# Patient Record
Sex: Female | Born: 2005 | Race: White | Hispanic: No | Smoking: Never smoker
Health system: Southern US, Community
[De-identification: ages and names within clinical notes are randomized; demographics above are authoritative.]

---
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
## 2024-03-19 ENCOUNTER — Ambulatory Visit: Admitting: Family Medicine

## 2024-03-19 ENCOUNTER — Encounter: Payer: Self-pay | Admitting: Family Medicine

## 2024-03-19 ENCOUNTER — Telehealth: Payer: Self-pay | Admitting: Family Medicine

## 2024-03-19 VITALS — BP 98/68 | HR 105 | Temp 98.1°F | Ht 63.0 in | Wt 139.0 lb

## 2024-03-19 DIAGNOSIS — M545 Low back pain, unspecified: Secondary | ICD-10-CM | POA: Insufficient documentation

## 2024-03-19 DIAGNOSIS — G4700 Insomnia, unspecified: Secondary | ICD-10-CM | POA: Insufficient documentation

## 2024-03-19 DIAGNOSIS — B001 Herpesviral vesicular dermatitis: Secondary | ICD-10-CM | POA: Insufficient documentation

## 2024-03-19 DIAGNOSIS — M25572 Pain in left ankle and joints of left foot: Secondary | ICD-10-CM | POA: Diagnosis not present

## 2024-03-19 DIAGNOSIS — R0789 Other chest pain: Secondary | ICD-10-CM

## 2024-03-19 LAB — URINALYSIS, ROUTINE W REFLEX MICROSCOPIC
Hgb urine dipstick: NEGATIVE
Ketones, ur: NEGATIVE
Leukocytes,Ua: NEGATIVE
Nitrite: NEGATIVE
Specific Gravity, Urine: >=1.03 — AB (ref 1.000–1.030)
Urine Glucose: NEGATIVE
Urobilinogen, UA: 0.2 (ref 0.0–1.0)
pH: 5.5 (ref 5.0–8.0)

## 2024-03-19 MED ORDER — MELOXICAM 15 MG PO TABS: 15.0000 mg | ORAL_TABLET | Freq: Every day | ORAL | 0 refills | Status: AC

## 2024-03-19 MED ORDER — DOCOSANOL 10 % EX CREA: TOPICAL_CREAM | CUTANEOUS | 0 refills | Status: AC

## 2024-03-19 MED ORDER — VALACYCLOVIR HCL 1 G PO TABS
2000.0000 mg | ORAL_TABLET | Freq: Two times a day (BID) | ORAL | 0 refills | Status: AC | PRN
Start: 2024-03-19 — End: ?

## 2024-03-19 NOTE — Progress Notes (Signed)
 Makiah Foye is a 18 y.o. female who presents today for an office visit.  Assessment/Plan:  New/Acute Problems: Ankle Pain No red flags.  Consistent with mild sprain.  Discussed conservative measures including rest, ice, compression, and elevation.  We also starting meloxicam 15 mg daily.  She will let us  know if not improving.  Left Chest Wall Pain Overall reassuring exam.  Low suspicion for rib fracture.  We did discuss imaging however this would not significantly change management at this point.  She is agreeable to defer imaging for now.  We are starting meloxicam as above which should help.  She will let us  know if not improving in the next few weeks  Chronic Problems Addressed Today: Low back pain This has been an intermittent issue for several years which she has associated with kidney issues.  Symptoms have worsened recently potentially due to recent fall and trauma.  We are starting meloxicam as above.  We are checking UA and urine culture today.  We did discuss imaging and referrals today however she would like to hold off on this for now.  She will let us  know if symptoms do not improve and would consider referral to orthopedics or sports medicine at that time.  Insomnia This has been a chronic issue for many years.  She has been on several medications in the past but did not find them particularly effective.  She is sometimes waking up at night with heavy breathing.  We did discuss referral for sleep study to rule out OSA or other potential causes however she would like to hold off on this for now.  We also did discuss trial of medication to help treatment for insomnia however she would like to hold on this for now as well.  She will let us  know if she changes her mind about referrals.  Cold sore Patient with cold sore on the left lower lip today.  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  We discussed treatment options for this.  Will send prescription in  for Valtrex to 2000 mg twice daily as needed for 1 day.  Also send prescription in for Abreva as she has done well with this previously.     Subjective:  HPI:  See assessment / plan for status of chronic conditions.  Patient here with a few issues that she like to discuss today.  She was at a trampoline park for a birthday party 3 days ago and while there she landed on her left ankle and had immediate pain to the area.  She did have some swelling but this has improved.  Still having some pain with certain motions but overall symptoms do seem to be improving.  She is able to walk with minimal pain  The next day she tripped at home and fell onto a Tree onto her left side and head.  She has had significant pain to her left wrist while but no bruising.  Pain is worse with certain motions.  She has also had ongoing back pain which has been an ongoing issue for many years but seems to have worsened recently.  She has tried many medications and is in the past including Advil  and Tylenol  without much improvement.  She would like to have kidneys checked today she is worried about recurrent kidney stones.  No fevers or chills.  She has also had ongoing issues with chronic insomnia.  This has been going on for years  as well.  She has been on several medications in the past but did not find them particularly effective.  She has also tried over-the-counter melatonin without much improvement.  She also has current fever blister on left lower lip.  Started a few days ago.  She has had this for many years intermittently and typically uses a cream which does help.        Objective:  Physical Exam: BP 98/68   Pulse (!) 105   Temp 98.1 F (36.7 C) (Temporal)   Ht 5' 3 (1.6 m)   Wt 139 lb (63 kg)   SpO2 99%   BMI 24.62 kg/m   Gen: No acute distress, resting comfortably HEENT: Healing cold sore on left lower lip CV: Regular rate and rhythm with no murmurs appreciated Pulm: Normal work of breathing,  clear to auscultation bilaterally with no crackles, wheezes, or rhonchi MUSCULOSKELETAL - Left Foot: No deformities.  Tender to palpation along anterior lateral malleolus.  Neurovascular intact distally.  Nontender to palpation along midfoot and posterior malleoli - ribs: No deformities.  No ecchymosis.  Tender to palpation along left lower costal edge - Back: No deformities.  Tenderness palpation along lower lumbar paraspinal muscle groups near SI joint.  Neurovascular intact distally Neuro: Grossly normal, moves all extremities Psych: Normal affect and thought content      Jasir Rother M. Kennyth, MD 03/19/2024 10:38 AM

## 2024-03-19 NOTE — Telephone Encounter (Signed)
 Called pt & stated no SOB, no problems urinating, and no other symptoms. Did not go to ED

## 2024-03-19 NOTE — Assessment & Plan Note (Signed)
 Patient with cold sore on the left lower lip today.  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  We discussed treatment options for this.  Will send prescription in for Valtrex to 2000 mg twice daily as needed for 1 day.  Also send prescription in for Abreva as she has done well with this previously.

## 2024-03-19 NOTE — Patient Instructions (Signed)
 It was very nice to see you today!  VISIT SUMMARY: During your visit, we addressed multiple injuries, back pain, and insomnia. You sustained an ankle sprain, a rib injury, and are experiencing chronic back pain and insomnia. We also discussed your recurrent fever blisters.  YOUR PLAN: RIGHT ANKLE SPRAIN: You injured your ankle at a trampoline park, and although the swelling has decreased, you still have sharp pain. -Apply ice and keep your ankle elevated. -Take meloxicam as prescribed to help with the pain.  RIB INJURY, POSSIBLE FRACTURE OR CONTUSION: You have soreness and difficulty breathing after falling into a cat tree. There is no bruising, but it could be a fracture or contusion. -Monitor your symptoms and use supportive care.  CHRONIC BACK PAIN You have severe back pain that might be related to a kidney condition. Over-the-counter pain relievers have not helped. -We will test your urine to check your kidney function. -Take meloxicam as prescribed to help with the pain.  INSOMNIA: You have chronic insomnia and have not found relief with previous treatments. You do not have symptoms of sleep apnea. -Consider a sleep study to better understand your sleep issues. -Follow up with us  if you are interested in pursuing a sleep study.  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 -Take Valtrex as prescribed. -Apply Abreva cream to the affected area.  Return if symptoms worsen or fail to improve.   Take care, Dr Kennyth  PLEASE NOTE:  If you had any lab tests, please let us  know if you have not heard back within a few days. You may see your results on mychart before we have a chance to review them but we will give you a call once they are reviewed by us .   If we ordered any referrals today, please let us  know if you have not heard from their office within the next week.   If you had any urgent prescriptions sent in  today, please check with the pharmacy within an hour of our visit to make sure the prescription was transmitted appropriately.   Please try these tips to maintain a healthy lifestyle:  Eat at least 3 REAL meals and 1-2 snacks per day.  Aim for no more than 5 hours between eating.  If you eat breakfast, please do so within one hour of getting up.   Each meal should contain half fruits/vegetables, one quarter protein, and one quarter carbs (no bigger than a computer mouse)  Cut down on sweet beverages. This includes juice, soda, and sweet tea.   Drink at least 1 glass of water with each meal and aim for at least 8 glasses per day  Exercise at least 150 minutes every week.

## 2024-03-19 NOTE — Assessment & Plan Note (Signed)
 This has been a chronic issue for many years.  She has been on several medications in the past but did not find them particularly effective.  She is sometimes waking up at night with heavy breathing.  We did discuss referral for sleep study to rule out OSA or other potential causes however she would like to hold off on this for now.  We also did discuss trial of medication to help treatment for insomnia however she would like to hold on this for now as well.  She will let us  know if she changes her mind about referrals.

## 2024-03-19 NOTE — Assessment & Plan Note (Signed)
 This has been an intermittent issue for several years which she has associated with kidney issues.  Symptoms have worsened recently potentially due to recent fall and trauma.  We are starting meloxicam as above.  We are checking UA and urine culture today.  We did discuss imaging and referrals today however she would like to hold off on this for now.  She will let us  know if symptoms do not improve and would consider referral to orthopedics or sports medicine at that time.

## 2024-03-20 ENCOUNTER — Ambulatory Visit: Payer: Self-pay

## 2024-03-20 NOTE — Telephone Encounter (Signed)
 FYI Only or Action Required?: FYI only for provider: appointment scheduled on 03/21/24.  Patient was last seen in primary care on 03/19/2024 by Kennyth Worth HERO, MD.  Called Nurse Triage reporting Fall.  Symptoms began yesterday.  Interventions attempted: OTC medications: Tylenol .  Symptoms are: gradually worsening.  Triage Disposition: See Physician Within 24 Hours  Patient/caregiver understands and will follow disposition?: Yes  Copied from CRM #[id number]. Topic: Clinical - Red Word Triage >> Mar 20, 2024  1:52 PM Asberry HERO wrote: Red Word that prompted transfer to Nurse Triage: patient fell down stairs, re injured ribs, having a lot of pain, shooting pain from ankle to knee, ripped toenail. Additional Information  Commented on: All Negative - See Physician Within 24 Hours    Pt with recent fall around 11/3 after falling on wooden cat tree and hitting left ribcage. Saw PCP on 11/7, low suspicion for fx at that time. Pt fell again last night with increased pain in left ribcage from moderate to severe. Denies SOB or coughing up blood. Pt noted to sound non-distressed and speaking in full clear sentences over the phone. No cuts or lacerations. Advised to f/u with provider in 24 hrs.  Answer Assessment - Initial Assessment Questions Pt with recent fall around 11/3 after falling on wooden cat tree and hitting left ribcage. Saw PCP on 11/7, low suspicion for rib fx at that time. Pt fell again last night after her socks got caught on her carpeted stairs with increased pain in left ribcage from moderate to severe. Denies SOB or coughing up blood. Pt noted to sound non-distressed and speaking in full clear sentences over the phone. No cuts or lacerations. Also hit head, no neuro changes, not taking a blood thinner, just mild swelling on temple area. Mild headache. Denies dizziness or domestic abuse, reports she is a generally clumsy person and falls frequently. Scheduled OV with PCP tomorrow, advised UC  or ED for worsening symptoms.  1. MECHANISM: How did the injury happen? For falls, ask: What height did you fall from? and What surface did you fall against?      Hit head several times. Once on car, once on tv, once on cat tree, once while walking down some stairs. Most recently fell down stairs last night. Reports mild headache at this time.  2. ONSET: When did the injury happen? (e.g., minutes, hours ago)      Last night  3. NEUROLOGIC SYMPTOMS: Was there any loss of consciousness? Are there any other neurological symptoms?      Minor headache currently  4. MENTAL STATUS: Does the person know who they are, who you are, and where they are?      AOO  5. LOCATION: What part of the head was hit?      Back and top of head  6. SCALP APPEARANCE: What does the scalp look like? Is it bleeding now? If Yes, ask: Is it difficult to stop?      Swollen on the front of head.   7. SIZE: For cuts, bruises, or swelling, ask: How large is it? (e.g., inches or centimeters)      Some swelling on front of head. No lacerations or bruising.  8. PAIN: Is there any pain? If Yes, ask: How bad is it? (Scale 0-10; or none, mild, moderate, severe)     Mild headache pain  9. TETANUS: For any breaks in the skin, ask: When was your last tetanus booster?     No cuts.  10. BLOOD THINNERS: Do you take any blood thinners? (e.g., aspirin, clopidogrel / Plavix, coumadin, heparin). Notes: Other strong blood thinners include: Arixtra (fondaparinux), Eliquis (apixaban), Pradaxa (dabigatran), and Xarelto (rivaroxaban).       Denies  11. OTHER SYMPTOMS: Do you have any other symptoms? (e.g., neck pain, vomiting)       Left ribcage injury/pain a few days prior to PCP appt yesterday, low suspicion for fracture per PCP. Hit left ribcage again after falling down stairs, now with 8/10 pain, 10/10 when breathing in. Denies SOB currently or coughing up blood. Also injured left ankle and left big toe  with some swelling.  12. PREGNANCY: Is there any chance you are pregnant? When was your last menstrual period?       Denies. Has IUD.  Answer Assessment - Initial Assessment Questions 1. MECHANISM: How did the fall happen?     Socks got caught on her carpeted stairs and pt fell down stairs  2. DOMESTIC VIOLENCE AND ELDER ABUSE SCREENING: Did you fall because someone pushed you or tried to hurt you? If Yes, ask: Are you safe now?     Pt denies. Reports feeling safe at home  3. ONSET: When did the fall happen? (e.g., minutes, hours, or days ago)     Last night  4. LOCATION: What part of the body hit the ground? (e.g., back, buttocks, head, hips, knees, hands, head, stomach)     Head, left ribcage, left ankle, foot and toenail  5. INJURY: Did you hurt (injure) yourself when you fell? If Yes, ask: What did you injure? Tell me more about this? (e.g., body area; type of injury; pain severity)     Mild swelling on front temple with mild headache. Left ribcage 8/10 pain, 10/10 when taking a deep breathe. Denies SOB or coughing up blood. Some swelling of left ankle and left foot/toenail, able to hobble around per pt.   6. PAIN: Is there any pain? If Yes, ask: How bad is the pain? (e.g., Scale 0-10; or none, mild,      Mild headache pain. Left ribcage 8/10 pain, 10/10 when taking a deep breathe. Moderate left ankle pain  7. SIZE: For cuts, bruises, or swelling, ask: How large is it? (e.g., inches or centimeters)      Moderate swelling left foot and ankle and left ribcage. Mild swelling left temple.  9. OTHER SYMPTOMS: Do you have any other symptoms? (e.g., dizziness, fever, weakness; new-onset or worsening).      Denies above.  10. CAUSE: What do you think caused the fall (or falling)? (e.g., dizzy spell, tripped)       Pt reports she is a generally clumsy person with hx of frequent falls.  Protocols used: Head Injury-A-AH, Falls and Falling-A-AH

## 2024-03-21 ENCOUNTER — Ambulatory Visit (INDEPENDENT_AMBULATORY_CARE_PROVIDER_SITE_OTHER)

## 2024-03-21 ENCOUNTER — Encounter: Payer: Self-pay | Admitting: Family Medicine

## 2024-03-21 ENCOUNTER — Ambulatory Visit: Payer: Self-pay | Admitting: Family Medicine

## 2024-03-21 ENCOUNTER — Ambulatory Visit (INDEPENDENT_AMBULATORY_CARE_PROVIDER_SITE_OTHER): Admitting: Family Medicine

## 2024-03-21 VITALS — BP 120/80 | HR 103 | Temp 99.0°F | Ht 63.0 in | Wt 143.6 lb

## 2024-03-21 DIAGNOSIS — M79675 Pain in left toe(s): Secondary | ICD-10-CM

## 2024-03-21 DIAGNOSIS — R0789 Other chest pain: Secondary | ICD-10-CM | POA: Diagnosis not present

## 2024-03-21 MED ORDER — ACETAMINOPHEN-CODEINE 300-30 MG PO TABS: 1.0000 | ORAL_TABLET | ORAL | 0 refills | Status: AC | PRN

## 2024-03-21 NOTE — Progress Notes (Signed)
 Good news!  Her x-ray is normal.  No signs of fracture.  She does have mild scoliosis in her back which is probably causing some of her back pain.  Her rib pain should improve over the next several days.  She should let us  know if she has any worsening symptoms or if her pain fails to improve.

## 2024-03-21 NOTE — Telephone Encounter (Signed)
 She probably has a contusion or bone bruise. This does not show up on X-rays but can still be very painful because the ribs have a lot of nerve endings.  This should heal over the next several days.   The radiologist did not mention any change in the scoliosis compared to her previous x-ray however the x-ray was primarily focusing on the ribs and is not the best test to compare for scoliosis.  We can obtain more x-rays to focus more on the scoliosis part of her back if she wants to come back for that or we can refer her to see a back specialist.

## 2024-03-21 NOTE — Progress Notes (Signed)
 Natasha Page is a 18 y.o. female who presents today for an office visit.  Assessment/Plan:  Left Rib Pain Patient here with left rib pain.  I saw her a couple days ago for this after recent fall however unfortunately she fell 2 days ago at home landing on the stairs at home and pain has significantly worsened.  She has not had much improvement meloxicam.  Will check plain film today to rule out fracture though did discuss with patient that pain control is the main priority at this point.  She has had bad reaction with tramadol in the past though has tolerated codeine well.  Will send in Tylenol  3 every 4-6 hours as needed.  Is okay for her to continue meloxicam with this as well.  We discussed reasons to return to care.  Left great toe pain Patient with subungual hematoma.  We did discuss plain film today however she declined as she does not think that she broke the toe.  Also discussed trephination however she declined this as well.  We recommended ice and compression.  We discussed reasons to return to care    Subjective:  HPI:  See assessment / plan for status of chronic conditions.   Discussed the use of AI scribe software for clinical note transcription with the patient, who gave verbal consent to proceed.  History of Present Illness Natasha Page is a 18 year old female who presents with increased pain following a fall down the stairs.  She was seen here 2 days ago for back and rib pain after being injured at a trampoline park.  Unfortunately the night after our most recent visit she fell down a spiral staircase at home while texting on her phone, slipping on the carpeted stairs while wearing socks. She fell backward, initially landing on her back and then turning onto her side as she descended a few steps. She estimates falling from the third step and landing on four stairs.  She describes significant pain in her ribs, lower back, and between her shoulder blades, which  has worsened since the fall. The pain is constant and described as a pressure-like tenderness, particularly in the ribs. She rates her back pain as a 6 out of 10, noting it is worse than usual but overshadowed by the rib pain. She has a history of chronic back pain, which she has been experiencing for years. No bruising was reported on her ribs, but she describes tenderness when taking a deep breath.  Her left ankle and toe are also painful. The toenail was partially detached during the fall, resulting in a blackened appearance due to dried blood underneath. She experiences pulsating pain in the toe and is unable to put pressure on it. The toe bled on the night of the fall, and she has been managing it with bandages and ointment.  She mentions hitting the back of her head during the fall, specifically on the occipital bone, and has since experienced difficulty concentrating and completing simple tasks, raising concerns about a possible mild concussion.  She has been taking meloxicam since the day of the fall but feels it has not provided significant relief. She continues to use Tylenol  for pain management. She has a history of adverse reactions to tramadol and morphine, including hallucinations and burning sensations, respectively. She is concerned about her sensitive stomach, which has reacted to medications like ibuprofen  in the past, causing nausea.         Objective:  Physical Exam: BP 120/80   Pulse (!) 103   Temp 99 F (37.2 C)   Ht 5' 3 (1.6 m)   Wt 143 lb 9.6 oz (65.1 kg)   SpO2 97%   BMI 25.44 kg/m   Gen: No acute distress, resting comfortably CV: Regular rate and rhythm with no murmurs appreciated Pulm: Normal work of breathing, clear to auscultation bilaterally with no crackles, wheezes, or rhonchi MUSCULOSKELETAL: - Chest: No deformities.  Tender to palpation along left lower ribs.  - Left Foot: No deformities or open wounds. Subungual hematoma noted under great toenail.   Neurovascularly intact distally.  Good distal cap refill.  Very tender to palpation. Neuro: Grossly normal, moves all extremities Psych: Normal affect and thought content      Kimble Delaurentis M. Kennyth, MD 03/21/2024 10:42 AM

## 2024-03-21 NOTE — Patient Instructions (Signed)
 It was very nice to see you today!  VISIT SUMMARY: You visited us  today due to increased pain following a fall down the stairs. We discussed your rib, back, ankle, and toe pain, as well as a potential mild concussion.  YOUR PLAN: RIB PAIN AFTER FALL: You have significant rib pain, and we are considering a possible rib fracture. -We ordered a chest x-ray to check for a rib fracture. -We prescribed Tylenol  with codeine for pain management.  TOE NAIL INJURY WITH SUBUNGUAL HEMATOMA: You have a painful and discolored toe due to a subungual hematoma. -Apply ice to the affected toe for pain relief. -We prescribed Tylenol  with codeine for pain management.  Return if symptoms worsen or fail to improve.   Take care, Dr Kennyth  PLEASE NOTE:  If you had any lab tests, please let us  know if you have not heard back within a few days. You may see your results on mychart before we have a chance to review them but we will give you a call once they are reviewed by us .   If we ordered any referrals today, please let us  know if you have not heard from their office within the next week.   If you had any urgent prescriptions sent in today, please check with the pharmacy within an hour of our visit to make sure the prescription was transmitted appropriately.   Please try these tips to maintain a healthy lifestyle:  Eat at least 3 REAL meals and 1-2 snacks per day.  Aim for no more than 5 hours between eating.  If you eat breakfast, please do so within one hour of getting up.   Each meal should contain half fruits/vegetables, one quarter protein, and one quarter carbs (no bigger than a computer mouse)  Cut down on sweet beverages. This includes juice, soda, and sweet tea.   Drink at least 1 glass of water with each meal and aim for at least 8 glasses per day  Exercise at least 150 minutes every week.

## 2024-03-22 LAB — URINE CULTURE
MICRO NUMBER:: 17194620
SPECIMEN QUALITY:: ADEQUATE

## 2024-03-24 ENCOUNTER — Encounter: Payer: Self-pay | Admitting: Family Medicine

## 2024-03-24 ENCOUNTER — Ambulatory Visit: Payer: Self-pay | Admitting: Family Medicine

## 2024-03-24 NOTE — Progress Notes (Signed)
 Her urine culture is positive for UTI.  This is probably where a lot of her back pain is coming from.  Please send in prescription for Keflex 500 mg twice daily x 7 days.

## 2024-03-25 ENCOUNTER — Telehealth: Payer: Self-pay

## 2024-03-25 ENCOUNTER — Telehealth: Payer: Self-pay | Admitting: *Deleted

## 2024-03-25 MED ORDER — CEPHALEXIN 500 MG PO CAPS: 500.0000 mg | ORAL_CAPSULE | Freq: Two times a day (BID) | ORAL | 0 refills | Status: AC

## 2024-03-25 NOTE — Telephone Encounter (Signed)
 Copied from CRM #[id number]. Topic: Clinical - Medication Question >> Mar 25, 2024  2:01 PM Natasha Page wrote: Reason for CRM: patient wants to know if Keflex will interfere with he IUD?   Spoke with patient advice to use condom when on Rx Keflex  Per PA [facility]

## 2024-03-25 NOTE — Telephone Encounter (Signed)
 This RN called pt and let her know that Keflex does not interfere with IUDs. All questions answered at this time.   Copied from CRM #[id number]. Topic: Clinical - Medication Question >> Mar 25, 2024  2:01 PM Alfonso HERO wrote: Reason for CRM: patient wants to know if Keflex will interfere with he IUD?

## 2024-03-25 NOTE — Telephone Encounter (Signed)
**Note De-identified  [place] Obfuscation** Please advise 

## 2024-03-26 NOTE — Telephone Encounter (Signed)
 I contacted patient via Mychart and notified her of Dr. Carroll note. If no response in twenty four  hours I will call patient for updates.

## 2024-03-26 NOTE — Telephone Encounter (Signed)
 I appreciate the update. It is ok for her to keep her nail bandaged.  It is possible that the nail may fall off on its own but if she is having ongoing pain or other issues recommend she come back for us  to take another look or we can refer her to see a podiatrist.  If the medication is not helpful then it is ok if she discontinues it.  Hopefully the pain should continue to improve though she should let us  know if she wishes to try something stronger.  Has she gotten started on the antibiotic for her urinary tract infection?

## 2024-03-26 NOTE — Telephone Encounter (Signed)
 See other message

## 2024-03-27 NOTE — Telephone Encounter (Signed)
 Copied from CRM [id number]. Topic: General - Call Back - No Documentation >> Mar 26, 2024  2:13 PM Alfonso ORN wrote: Reason for CRM: pt called to f/u on question sent to pcp . Pt requesting mychart message or call from pcp   Unable to LVM, phone disconnected  Jerrel Tiberio,RMA

## 2024-04-01 ENCOUNTER — Ambulatory Visit: Payer: Self-pay

## 2024-04-01 NOTE — Telephone Encounter (Signed)
 FYI Only or Action Required?: Action required by provider: request for appointment.  Patient was last seen in primary care on 03/21/2024 by Kennyth Worth HERO, MD.  Called Nurse Triage reporting Back Pain.  Symptoms began several weeks ago.  Interventions attempted: Nothing.  Symptoms are: unchanged.  Triage Disposition: See PCP When Office is Open (Within 3 Days)  Patient/caregiver understands and will follow disposition?: Yes   Copied from CRM #[id number]. Topic: Clinical - Red Word Triage >> Apr 01, 2024  4:04 PM Rea BROCKS wrote: Red Word that prompted transfer to Nurse Triage: Patient still experiencing pain in ribs and back, medicine not helping. Reason for Disposition  [1] MODERATE back pain (e.g., interferes with normal activities) AND [2] present > 3 days  Answer Assessment - Initial Assessment Questions No available appts today, patient requests appt with Dr. Kennyth; scheduled 04/04/24.  Patient requesting sooner appt and call back; medication alternative  Advised call back or UC/ED if symptoms worsen 1. ONSET: When did the pain begin? (e.g., minutes, hours, days)     03/21/24 2. LOCATION: Where does it hurt? (upper, mid or lower back)     Shoulder blades, rib/chest,  back pain; already seen provider 3. SEVERITY: How bad is the pain?  (e.g., Scale 1-10; mild, moderate, or severe)     8/10 with sitting, 10/10 with movement 4. PATTERN: Is the pain constant? (e.g., yes, no; constant, intermittent)      constant 5. RADIATION: Does the pain shoot into your legs or somewhere else?     no 6. CAUSE:  What do you think is causing the back pain?      injury  8. MEDICINES: What have you taken so far for the pain? (e.g., nothing, acetaminophen , NSAIDS)     acetaminophen  codeine; not effective 9. NEUROLOGIC SYMPTOMS: Do you have any weakness, numbness, or problems with bowel/bladder control?     no 10. OTHER SYMPTOMS: Do you have any other symptoms? (e.g., fever,  abdomen pain, burning with urination, blood in urine) nausea      Denies fever, chills, vomiting, abd pain, chest pain( feels like pressure/stretching, chronic)  Protocols used: Back Pain-A-AH

## 2024-04-02 NOTE — Telephone Encounter (Signed)
 Noted, Patient has an OV on 04/04/2024

## 2024-04-04 ENCOUNTER — Encounter: Payer: Self-pay | Admitting: Family Medicine

## 2024-04-04 ENCOUNTER — Ambulatory Visit: Admitting: Family Medicine

## 2024-04-04 VITALS — BP 110/70 | HR 81 | Temp 98.2°F | Ht 63.0 in | Wt 144.0 lb

## 2024-04-04 DIAGNOSIS — M545 Low back pain, unspecified: Secondary | ICD-10-CM | POA: Diagnosis not present

## 2024-04-04 MED ORDER — PREDNISONE 20 MG PO TABS
20.0000 mg | ORAL_TABLET | Freq: Every day | ORAL | 0 refills | Status: AC
Start: 2024-04-04 — End: ?

## 2024-04-04 NOTE — Progress Notes (Signed)
 Natasha Page is a 18 y.o. female who presents today for an office visit.  Assessment/Plan:  Back Pain Patient with ongoing low back pain that has been recurrent nature though worse in the last several weeks since her recent fall.  Does have a history of scoliosis which is contributing.  She has quite a bit of tightness on exam today as well although no red flags or signs of nerve involvement.  She did not have much improvement on meloxicam .  We did discuss trial of muscle relaxer however she declined as she has not found these to be beneficial in the past.  We did discuss trial of prednisone  and she is agreeable to this.  Will start 20 mg daily for 5 days.  Will refer to sports medicine for further management given recurrent nature of her back pain.  Left rib pain Improving.  X-ray from a couple weeks ago was negative for fracture.  Hopefully will have continued improve with above prednisone .  Left toe subungual hematoma Improving.  Did discuss with patient that her nail may fall off though she can continue keeping the area bandaged as she is doing currently.  UTI She is currently on Keflex  500 mg twice daily and has a few days left of her antibiotics.  Symptoms seem to be improving.  She will let us  know if she has any recurrence with this.     Subjective:  HPI:  See assessment / plan for status of chronic conditions.   Discussed the use of AI scribe software for clinical note transcription with the patient, who gave verbal consent to proceed.  History of Present Illness Natasha Page is a 18 year old female who presents with left rib pain and back pain after recent falls.  She has been experiencing left rib pain and back pain following recent falls. Imaging studies conducted two weeks ago were negative for fractures. She was prescribed Tylenol  #3 and continued on meloxicam , but the medication caused excessive drowsiness and did not alleviate the pain, impacting her  daily activities. Her rib pain has slightly improved but remains significant, especially when breathing, talking, or coughing. She reports difficulty resting due to a busy schedule and trouble sleeping. Her back pain initially improved but then worsened overnight and has remained severe, affecting her spine and daily functioning.  She also has a left toe subungual hematoma, which was previously discussed with an option for trephination that she declined. The toe is still tender and recently bled spontaneously, soaking through her sock. The pain has decreased significantly since the last visit, but the toenail remains black with dried blood underneath.  She is currently on antibiotics for a urinary tract infection (UTI) but did not start the medication immediately after it was prescribed. She reports some lingering UTI symptoms but is still completing the course of antibiotics.  Her past medical history includes chronic migraines, scoliosis, and a history of kidney problems. She has experienced recurrent back issues for nearly eleven years, having tried various muscle relaxers in the past without relief. She regularly stretches.  No new falls, bruising, numbness, or tingling in the affected areas. She notes random bruises on her legs and has been cautious to avoid further injury.         Objective:  Physical Exam: BP 110/70   Pulse 81   Temp 98.2 F (36.8 C) (Temporal)   Ht 5' 3 (1.6 m)   Wt 144 lb (65.3 kg)   LMP 03/28/2024  SpO2 98%   BMI 25.51 kg/m   Gen: No acute distress, resting comfortably MUSCULOSKELETAL - Back: No deformities.  Tenderness to palpation along bilateral lower lumbar paraspinal muscle groups with tender nodules noted - Left foot: No deformities though subungual hematoma noted under right great toenail.  Neurovascular intact distally.  Good distal cap refill.  Tenderness to palpation is improved compared to previous exam. Neuro: Grossly normal, moves all  extremities Psych: Normal affect and thought content      Bobbye Petti M. Kennyth, MD 04/04/2024 10:11 AM

## 2024-04-04 NOTE — Patient Instructions (Signed)
 It was very nice to see you today!  VISIT SUMMARY: You visited us  today due to left rib pain and back pain following recent falls. We also discussed your left toe subungual hematoma and your ongoing treatment for a urinary tract infection.  YOUR PLAN: LEFT RIB PAIN: You have persistent left rib pain after recent falls, with no fractures found on imaging. -We are prescribing prednisone  to help with inflammation and pain management.  CHRONIC BACK PAIN: You have chronic back pain with muscle spasms and scoliosis, which affects your daily activities. -We are prescribing prednisone  to help with inflammation and muscle spasms. -We are referring you to a sports medicine group for further evaluation  LEFT TOE SUBUNGUAL HEMATOMA: Your left toe has a subungual hematoma with tenderness and occasional bleeding, but the pain has improved. -Continue with the current management and monitor for further improvement.  URINARY TRACT INFECTION: You are currently being treated for a urinary tract infection, and your symptoms are improving. -Continue taking your antibiotics as prescribed. -Monitor your symptoms and follow up if they persist.  Return if symptoms worsen or fail to improve.   Take care, Dr Kennyth  PLEASE NOTE:  If you had any lab tests, please let us  know if you have not heard back within a few days. You may see your results on mychart before we have a chance to review them but we will give you a call once they are reviewed by us .   If we ordered any referrals today, please let us  know if you have not heard from their office within the next week.   If you had any urgent prescriptions sent in today, please check with the pharmacy within an hour of our visit to make sure the prescription was transmitted appropriately.   Please try these tips to maintain a healthy lifestyle:  Eat at least 3 REAL meals and 1-2 snacks per day.  Aim for no more than 5 hours between eating.  If you eat breakfast,  please do so within one hour of getting up.   Each meal should contain half fruits/vegetables, one quarter protein, and one quarter carbs (no bigger than a computer mouse)  Cut down on sweet beverages. This includes juice, soda, and sweet tea.   Drink at least 1 glass of water with each meal and aim for at least 8 glasses per day  Exercise at least 150 minutes every week.
